# Patient Record
Sex: Male | Born: 2005 | Race: Black or African American | Hispanic: No | Marital: Single | Smoking: Never smoker
Health system: Southern US, Community
[De-identification: ages and names within clinical notes are randomized; demographics above are authoritative.]

## PROBLEM LIST (undated history)

## (undated) DIAGNOSIS — J45909 Unspecified asthma, uncomplicated: Secondary | ICD-10-CM

---
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
## 2023-01-01 ENCOUNTER — Other Ambulatory Visit: Payer: Self-pay | Admitting: Student

## 2023-01-01 DIAGNOSIS — J302 Other seasonal allergic rhinitis: Secondary | ICD-10-CM

## 2023-01-01 DIAGNOSIS — J4530 Mild persistent asthma, uncomplicated: Secondary | ICD-10-CM

## 2023-03-07 ENCOUNTER — Other Ambulatory Visit: Payer: Self-pay | Admitting: Student

## 2023-03-07 DIAGNOSIS — J4530 Mild persistent asthma, uncomplicated: Secondary | ICD-10-CM

## 2023-07-24 ENCOUNTER — Other Ambulatory Visit: Payer: Self-pay | Admitting: Student

## 2023-07-24 DIAGNOSIS — J302 Other seasonal allergic rhinitis: Secondary | ICD-10-CM

## 2023-07-24 DIAGNOSIS — J4530 Mild persistent asthma, uncomplicated: Secondary | ICD-10-CM

## 2023-07-29 ENCOUNTER — Other Ambulatory Visit: Payer: Self-pay | Admitting: Student

## 2023-07-29 ENCOUNTER — Other Ambulatory Visit: Payer: Self-pay

## 2023-07-29 DIAGNOSIS — J302 Other seasonal allergic rhinitis: Secondary | ICD-10-CM

## 2023-07-29 DIAGNOSIS — J4530 Mild persistent asthma, uncomplicated: Secondary | ICD-10-CM

## 2023-07-29 MED ORDER — ALBUTEROL SULFATE HFA 108 (90 BASE) MCG/ACT IN AERS: 2.0000 | INHALATION_SPRAY | Freq: Four times a day (QID) | RESPIRATORY_TRACT | 6 refills | Status: AC | PRN

## 2023-10-09 ENCOUNTER — Other Ambulatory Visit: Payer: Self-pay | Admitting: Student

## 2023-10-09 DIAGNOSIS — J4530 Mild persistent asthma, uncomplicated: Secondary | ICD-10-CM

## 2023-10-12 ENCOUNTER — Encounter: Payer: Self-pay | Admitting: Family Medicine

## 2023-10-12 ENCOUNTER — Ambulatory Visit: Payer: Medicaid Other | Admitting: Family Medicine

## 2023-10-12 ENCOUNTER — Other Ambulatory Visit (HOSPITAL_COMMUNITY)
Admission: RE | Admit: 2023-10-12 | Discharge: 2023-10-12 | Disposition: A | Discharge status: Home / Self Care | Payer: Medicaid Other | Source: Ambulatory Visit | Attending: Family Medicine | Admitting: Family Medicine

## 2023-10-12 VITALS — BP 130/78 | HR 69 | Ht 75.0 in | Wt 201.0 lb

## 2023-10-12 DIAGNOSIS — R21 Rash and other nonspecific skin eruption: Secondary | ICD-10-CM | POA: Diagnosis not present

## 2023-10-12 DIAGNOSIS — B360 Pityriasis versicolor: Secondary | ICD-10-CM

## 2023-10-12 DIAGNOSIS — Z113 Encounter for screening for infections with a predominantly sexual mode of transmission: Secondary | ICD-10-CM | POA: Insufficient documentation

## 2023-10-12 MED ORDER — KETOCONAZOLE 2 % EX SHAM: MEDICATED_SHAMPOO | CUTANEOUS | 3 refills | Status: AC

## 2023-10-12 MED ORDER — KETOCONAZOLE 2 % EX SHAM: 1.0000 | MEDICATED_SHAMPOO | CUTANEOUS | 3 refills | Status: DC

## 2023-10-12 NOTE — Patient Instructions (Addendum)
It was wonderful to see you today.  Please bring ALL of your medications with you to every visit.   Today we talked about:   I think you have a fungal rash called tinea  This can come and go with sweating  I sent in a shampoo to use  Lather this up in the shower  Let it sit for 10 minutes or so Rinse it off Use daily for a week  Then use as needed   Our lab at our clinic is closed today. I apologize for this issue.  You can  Go to [name] first floor lab corp today (across the street near Cardiology)  OR 2. Return at a later day for labs  If you choose to return for a later date, please schedule a visit  The orders are placed    Please follow up in 12 months   Thank you for choosing [facility].   Please call [phone] with any questions about today's appointment.  Please be sure to schedule follow up at the front  desk before you leave today.   Terisa Starr, MD  Family Medicine

## 2023-10-12 NOTE — Addendum Note (Signed)
Addended by: Manson Passey, Joevon Holliman on: 10/12/2023 04:26 PM   Modules accepted: Orders

## 2023-10-12 NOTE — Progress Notes (Signed)
    SUBJECTIVE:   CHIEF COMPLAINT:rash HPI:   Craig [name] is a 17 y.o.  with history notable for allergies presenting for rash.  Patient reports overall he is doing well.  He is working in a [employer].  He is sex active with 1 partner.  He is amenable to STI testing today.  Uses condoms consistently.  No alcohol or drug use.  The patient reports a several week history of a rash.  He previously had this when he was a teenager.  He describes it as not really pruritic but progressing from his stomach to his upper back.  He has tried nothing for it.  He would like it to go away.  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  Fine slightly annular hyperpigmented and hypopigmented rash on the trunk and upper back.  He also has a small corn on the left neck area.  ASSESSMENT/PLAN:   Assessment & Plan Rash Sexually-transmitted faction testing as below. Tinea versicolor Ketoconazole prescribed.  RPR sent differential does include syphilis.  See AVS for details of how to use ketoconazole.  I discussed this was likely recur.  We discussed this is common and related to sweating. Routine screening for STI (sexually transmitted infection) Testing today discussed with patient will send messages via MyChart   Terisa Starr, MD  Family Medicine Teaching Service  [facility]

## 2023-10-13 ENCOUNTER — Encounter: Payer: Self-pay | Admitting: Family Medicine

## 2023-10-13 LAB — URINE CYTOLOGY ANCILLARY ONLY
Chlamydia: NEGATIVE
Comment: NEGATIVE
Comment: NORMAL
Neisseria Gonorrhea: NEGATIVE

## 2023-10-17 ENCOUNTER — Other Ambulatory Visit: Payer: Medicaid Other

## 2023-10-25 DIAGNOSIS — H5213 Myopia, bilateral: Secondary | ICD-10-CM | POA: Diagnosis not present

## 2024-01-18 ENCOUNTER — Other Ambulatory Visit: Payer: Self-pay | Admitting: Student

## 2024-01-18 DIAGNOSIS — J302 Other seasonal allergic rhinitis: Secondary | ICD-10-CM

## 2024-01-18 DIAGNOSIS — J4530 Mild persistent asthma, uncomplicated: Secondary | ICD-10-CM

## 2024-05-25 ENCOUNTER — Ambulatory Visit (INDEPENDENT_AMBULATORY_CARE_PROVIDER_SITE_OTHER): Admitting: Family Medicine

## 2024-05-25 VITALS — BP 123/75 | HR 84 | Ht 75.0 in | Wt 202.2 lb

## 2024-05-25 DIAGNOSIS — L989 Disorder of the skin and subcutaneous tissue, unspecified: Secondary | ICD-10-CM

## 2024-05-25 NOTE — Progress Notes (Signed)
    SUBJECTIVE:   CHIEF COMPLAINT / HPI:   18 yo M patient presents for evaluation of mole on back and wanting to know if this can be removed. Seen previously in clinic on 10/2023 with tinea versicolor noted along with small corn on left neck. Today, wants to discuss a mole that popped up 7 months ago. Does not hurt or itch. Never gets caught on anything. Sometimes it bothers him because he feels it when he's putting his hair up.    Objective:    BP 123/75   Pulse 84   Ht 6' 3 (1.905 m)   Wt 202 lb 4 oz (91.7 kg)   SpO2 100%   BMI 25.28 kg/m  General:  alert, cooperative, and no distress  Skin:  Raised wart-like lesion noted on left posterior neck without surrounding erythema or rash      Assessment & Plan:     Assessment & Plan Skin lesion of neck Patient with skin lesion on posterior left neck most consistent with wart vs skin tag. Cryotherapy completed today. Plan to follow up as needed.  Diagnosis: Wart Procedure: Cryotherapy Location: Left posterior neck   After discussion of the risks, benefits, and alternative therapies available, the patient elected to proceed. After obtaining written informed consent, the patient's identity, procedure, and site were verified during a time out prior to proceeding procedure. The lesions on the left neck were treated using liquid nitrogen spray gun for 6 second per cycle, 3 cycles total. The patient tolerated the procedure well and there were no immediate complications.  Patient was provided aftercare handout and advised to return if lesion(s) did not fully resolved.    Gloriann Ogren, MD [facility]

## 2024-05-25 NOTE — Patient Instructions (Addendum)
 It was wonderful to see you today.  Please bring ALL of your medications with you to every visit.   Today we talked about:  The wart/skin tag on your neck. I froze it off today. You should expect the area to turn white. It should eventually fall off. Please do not pick at the skin. If you are concerned about the area blistering or being infected, please return.   Thank you for choosing [facility].   Please call [phone] with any questions about today's appointment.  Please arrive at least 15 minutes prior to your scheduled appointments.   If you had blood work today, I will send you a MyChart message or a letter if results are normal. Otherwise, I will give you a call.   If you had a referral placed, they will call you to set up an appointment. Please give us  a call if you don't hear back in the next 2 weeks.   If you need additional refills before your next appointment, please call your pharmacy first.   You should follow up in our clinic in Return if symptoms worsen or fail to improve.  Gloriann Ogren, MD Family Medicine

## 2024-06-28 ENCOUNTER — Other Ambulatory Visit: Payer: Self-pay | Admitting: Student

## 2024-06-28 DIAGNOSIS — J4530 Mild persistent asthma, uncomplicated: Secondary | ICD-10-CM

## 2024-08-08 ENCOUNTER — Other Ambulatory Visit: Payer: Self-pay | Admitting: Student

## 2024-08-08 DIAGNOSIS — J302 Other seasonal allergic rhinitis: Secondary | ICD-10-CM

## 2024-08-08 DIAGNOSIS — J4530 Mild persistent asthma, uncomplicated: Secondary | ICD-10-CM

## 2024-08-27 ENCOUNTER — Other Ambulatory Visit: Payer: Self-pay | Admitting: Student

## 2024-08-27 DIAGNOSIS — J4530 Mild persistent asthma, uncomplicated: Secondary | ICD-10-CM

## 2024-08-27 DIAGNOSIS — J302 Other seasonal allergic rhinitis: Secondary | ICD-10-CM
